# Patient Record
Sex: Male | Born: 1950 | Race: Black or African American | Hispanic: No | Marital: Married | Smoking: Never smoker
Health system: Southern US, Community
[De-identification: ages and names within clinical notes are randomized; demographics above are authoritative.]

## PROBLEM LIST (undated history)

## (undated) DIAGNOSIS — I10 Essential (primary) hypertension: Secondary | ICD-10-CM

## (undated) DIAGNOSIS — E7800 Pure hypercholesterolemia, unspecified: Secondary | ICD-10-CM

---

## 2016-11-26 LAB — GLUCOSE, POCT (MANUAL RESULT ENTRY): POC GLUCOSE: 110 mg/dL — AB (ref 70–99)

## 2020-04-21 ENCOUNTER — Other Ambulatory Visit: Payer: Self-pay

## 2020-04-21 ENCOUNTER — Emergency Department (HOSPITAL_BASED_OUTPATIENT_CLINIC_OR_DEPARTMENT_OTHER)
Admission: EM | Admit: 2020-04-21 | Discharge: 2020-04-21 | Disposition: A | Discharge status: Home / Self Care | Payer: Federal, State, Local not specified - PPO | Attending: Emergency Medicine | Admitting: Emergency Medicine

## 2020-04-21 ENCOUNTER — Encounter (HOSPITAL_BASED_OUTPATIENT_CLINIC_OR_DEPARTMENT_OTHER): Payer: Self-pay | Admitting: Emergency Medicine

## 2020-04-21 DIAGNOSIS — I159 Secondary hypertension, unspecified: Secondary | ICD-10-CM

## 2020-04-21 DIAGNOSIS — I10 Essential (primary) hypertension: Secondary | ICD-10-CM | POA: Diagnosis present

## 2020-04-21 DIAGNOSIS — Z79899 Other long term (current) drug therapy: Secondary | ICD-10-CM | POA: Diagnosis not present

## 2020-04-21 DIAGNOSIS — R42 Dizziness and giddiness: Secondary | ICD-10-CM

## 2020-04-21 HISTORY — DX: Essential (primary) hypertension: I10

## 2020-04-21 HISTORY — DX: Pure hypercholesterolemia, unspecified: E78.00

## 2020-04-21 NOTE — ED Triage Notes (Signed)
Pt arrives pov with concern for B/P after being sent by UC. Pt originally went to [facility] for covid test d/t sweating and dizziness today. Pt denies dizziness at this time.

## 2020-04-21 NOTE — ED Notes (Signed)
ED Provider at bedside. 

## 2020-04-21 NOTE — ED Provider Notes (Signed)
[facility] EMERGENCY DEPARTMENT Provider Note   CSN: [id number] Arrival date & time: 04/21/20  1307     History Chief Complaint  Patient presents with  . Hypertension    William Fox is a 69 y.o. male.  Patient sent here from urgent care for EKG.  Patient had an episode of dizziness today.  Blood pressure was elevated but states that he has not taken his blood pressure medications today.  Urgent care did not have the capability to get EKG.  He is asymptomatic.  No chest pain, no shortness of breath.  Patient was concerned for Covid symptoms and had a Covid test done at urgent care.  The history is provided by the patient.  Hypertension This is a chronic problem. The problem occurs daily. The problem has not changed since onset.Pertinent negatives include no chest pain, no abdominal pain, no headaches and no shortness of breath. Nothing aggravates the symptoms. Nothing relieves the symptoms. He has tried nothing for the symptoms. The treatment provided no relief.       Past Medical History:  Diagnosis Date  . Hypercholesteremia   . Hypertension     There are no problems to display for this patient.   History reviewed. No pertinent surgical history.     History reviewed. No pertinent family history.  Social History   Tobacco Use  . Smoking status: Never Smoker  Substance Use Topics  . Alcohol use: Yes    Comment: social  . Drug use: Never    Home Medications Prior to Admission medications   Medication Sig Start Date End Date Taking? Authorizing Provider  benazepril (LOTENSIN) 40 MG tablet Take 1 tablet by mouth daily. 01/16/20  Yes [provider]  labetalol (NORMODYNE) 100 MG tablet Take 1 tablet by mouth 2 (two) times daily. 12/14/19  Yes [provider]  rosuvastatin (CRESTOR) 10 MG tablet TAKE 1 TABLET BY MOUTH EVERY DAY AT NIGHT 02/27/20  Yes [provider]    Allergies    Patient has no allergy information on  record.  Review of Systems   Review of Systems  Constitutional: Negative for chills and fever.  HENT: Negative for ear pain and sore throat.   Eyes: Negative for pain and visual disturbance.  Respiratory: Negative for cough and shortness of breath.   Cardiovascular: Negative for chest pain and palpitations.  Gastrointestinal: Negative for abdominal pain and vomiting.  Genitourinary: Negative for dysuria and hematuria.  Musculoskeletal: Negative for arthralgias and back pain.  Skin: Negative for color change and rash.  Neurological: Positive for dizziness. Negative for seizures, syncope and headaches.  All other systems reviewed and are negative.   Physical Exam Updated Vital Signs BP (!) 186/104   Pulse 64   Temp 98.6 F (37 C)   Resp 18   Ht 5\' 9"  (1.753 m)   Wt 88.5 kg   SpO2 99%   BMI 28.80 kg/m   Physical Exam Vitals and nursing note reviewed.  Constitutional:      Appearance: He is well-developed.  HENT:     Head: Normocephalic and atraumatic.     Nose: Nose normal.     Mouth/Throat:     Mouth: Mucous membranes are moist.  Eyes:     Extraocular Movements: Extraocular movements intact.     Conjunctiva/sclera: Conjunctivae normal.     Pupils: Pupils are equal, round, and reactive to light.  Cardiovascular:     Rate and Rhythm: Normal rate and regular rhythm.  Pulses: Normal pulses.     Heart sounds: No murmur heard.   Pulmonary:     Effort: Pulmonary effort is normal. No respiratory distress.     Breath sounds: Normal breath sounds.  Abdominal:     Palpations: Abdomen is soft.     Tenderness: There is no abdominal tenderness.  Musculoskeletal:     Cervical back: Neck supple.  Skin:    General: Skin is warm and dry.  Neurological:     General: No focal deficit present.     Mental Status: He is alert and oriented to person, place, and time.     Cranial Nerves: No cranial nerve deficit.     Sensory: No sensory deficit.     Motor: No weakness.      Coordination: Coordination normal.     ED Results / Procedures / Treatments   Labs (all labs ordered are listed, but only abnormal results are displayed) Labs Reviewed - No data to display  EKG None  Radiology No results found.  Procedures Procedures (including critical care time)  Medications Ordered in ED Medications - No data to display  ED Course  I have reviewed the triage vital signs and the nursing notes.  Pertinent labs & imaging results that were available during my care of the patient were reviewed by me and considered in my medical decision making (see chart for details).    MDM Rules/Calculators/A&P                          William Fox is a 69 year old male with history of high blood pressure presents the ED with hypertension.  Mildly elevated blood pressure but otherwise normal vitals.  Normal neurological exam.  Sent by urgent care for EKG after episode of dizziness this morning.  Asymptomatic now.  Neurologically intact.  Blood pressure is high but he has not taken his chronic blood pressure medications today.  EKG shows sinus rhythm with PVCs.  States he has a history of the same.  No heart block.  Overall patient given reassurance.  Recommend follow-up with primary care doctor and discharged from ED in good condition.  This chart was dictated using voice recognition software.  Despite best efforts to proofread,  errors can occur which can change the documentation meaning.   Final Clinical Impression(s) / ED Diagnoses Final diagnoses:  Secondary hypertension  Dizziness    Rx / DC Orders ED Discharge Orders    None       Virgina Norfolk, DO 04/21/20 1351

## 2020-04-24 ENCOUNTER — Other Ambulatory Visit: Payer: Self-pay

## 2020-04-24 ENCOUNTER — Encounter (HOSPITAL_BASED_OUTPATIENT_CLINIC_OR_DEPARTMENT_OTHER): Payer: Self-pay | Admitting: *Deleted

## 2020-04-24 ENCOUNTER — Emergency Department (HOSPITAL_BASED_OUTPATIENT_CLINIC_OR_DEPARTMENT_OTHER): Payer: Federal, State, Local not specified - PPO

## 2020-04-24 ENCOUNTER — Emergency Department (HOSPITAL_BASED_OUTPATIENT_CLINIC_OR_DEPARTMENT_OTHER)
Admission: EM | Admit: 2020-04-24 | Discharge: 2020-04-24 | Disposition: A | Discharge status: Home / Self Care | Payer: Federal, State, Local not specified - PPO | Attending: Emergency Medicine | Admitting: Emergency Medicine

## 2020-04-24 DIAGNOSIS — I10 Essential (primary) hypertension: Secondary | ICD-10-CM | POA: Diagnosis not present

## 2020-04-24 DIAGNOSIS — Z79899 Other long term (current) drug therapy: Secondary | ICD-10-CM | POA: Diagnosis not present

## 2020-04-24 DIAGNOSIS — I160 Hypertensive urgency: Secondary | ICD-10-CM

## 2020-04-24 DIAGNOSIS — R42 Dizziness and giddiness: Secondary | ICD-10-CM

## 2020-04-24 LAB — BASIC METABOLIC PANEL
Anion gap: 10 (ref 5–15)
BUN: 13 mg/dL (ref 8–23)
CO2: 26 mmol/L (ref 22–32)
Calcium: 9.3 mg/dL (ref 8.9–10.3)
Chloride: 100 mmol/L (ref 98–111)
Creatinine, Ser: 1.36 mg/dL — ABNORMAL HIGH (ref 0.61–1.24)
GFR, Estimated: 53 mL/min — ABNORMAL LOW (ref >60)
Glucose, Bld: 97 mg/dL (ref 70–99)
Potassium: 3.4 mmol/L — ABNORMAL LOW (ref 3.5–5.1)
Sodium: 136 mmol/L (ref 135–145)

## 2020-04-24 LAB — CBC
HCT: 46.8 % (ref 39.0–52.0)
Hemoglobin: 16.1 g/dL (ref 13.0–17.0)
MCH: 30.6 pg (ref 26.0–34.0)
MCHC: 34.4 g/dL (ref 30.0–36.0)
MCV: 88.8 fL (ref 80.0–100.0)
Platelets: 150 10*3/uL (ref 150–400)
RBC: 5.27 MIL/uL (ref 4.22–5.81)
RDW: 12.9 % (ref 11.5–15.5)
WBC: 10.6 10*3/uL — ABNORMAL HIGH (ref 4.0–10.5)
nRBC: 0 % (ref 0.0–0.2)

## 2020-04-24 LAB — TROPONIN I (HIGH SENSITIVITY)
Troponin I (High Sensitivity): 11 ng/L (ref <18)
Troponin I (High Sensitivity): 14 ng/L (ref <18)

## 2020-04-24 LAB — CBG MONITORING, ED: Glucose-Capillary: 103 mg/dL — ABNORMAL HIGH (ref 70–99)

## 2020-04-24 MED ORDER — HYDRALAZINE HCL 20 MG/ML IJ SOLN
20.0000 mg | Freq: Once | INTRAMUSCULAR | Status: AC
  Administered 2020-04-24: 20 mg via INTRAVENOUS
  Filled 2020-04-24: qty 1

## 2020-04-24 MED ORDER — NITROGLYCERIN 2 % TD OINT: 1.0000 [in_us] | TOPICAL_OINTMENT | Freq: Four times a day (QID) | TRANSDERMAL | Status: DC

## 2020-04-24 MED ORDER — LABETALOL HCL 5 MG/ML IV SOLN
20.0000 mg | Freq: Once | INTRAVENOUS | Status: AC
  Administered 2020-04-24: 20 mg via INTRAVENOUS
  Filled 2020-04-24: qty 4

## 2020-04-24 MED ORDER — LABETALOL HCL 100 MG PO TABS
100.0000 mg | ORAL_TABLET | Freq: Once | ORAL | Status: AC
  Administered 2020-04-24: 100 mg via ORAL
  Filled 2020-04-24: qty 1

## 2020-04-24 MED ORDER — LABETALOL HCL 100 MG PO TABS: 200.0000 mg | ORAL_TABLET | Freq: Two times a day (BID) | ORAL | 0 refills | Status: AC

## 2020-04-24 MED ORDER — HYDRALAZINE HCL 10 MG PO TABS: 10.0000 mg | ORAL_TABLET | Freq: Three times a day (TID) | ORAL | 0 refills | Status: AC | PRN

## 2020-04-24 NOTE — ED Provider Notes (Signed)
[facility] EMERGENCY DEPARTMENT Provider Note   CSN: [id number] Arrival date & time: 04/24/20  1850     History No chief complaint on file.   William Fox is a 69 y.o. male.  HPI Patient episode of lightheadedness and dizziness on Saturday.  He had gone to the urgent care due to concern for possible Covid symptoms.  Testing was done in urgent care but patient was dizzy and referred to the emergency department for further evaluation.  Patient reports that symptom he was concerned about was having sweats.  He has not had cough, shortness of breath or chest pain.  No headache, blurred vision or double vision.  Patient reports again today he started to feel lightheaded and kind of shaky.  Reportedly he was repeating himself but did not have any focal weakness numbness tingling or visual changes.  Patient denies he had a headache during the episode.  He reports again he felt sweaty.  No chest pain, shortness of breath or syncopal episode.  Patient has not been having lower extremity swelling or calf pain.  No personal history of DVT or PE.  He reports his sister did have a PE from which she died.  Patient reports he is compliant with the blood pressure medications.  He reports he is already taking the medications today.  He reports typically her blood pressures are controlled around 140s over 90s, but at the doctor's office is more typically 160s over 90s to 100.    Past Medical History:  Diagnosis Date   Hypercholesteremia    Hypertension     There are no problems to display for this patient.   History reviewed. No pertinent surgical history.     No family history on file.  Social History   Tobacco Use   Smoking status: Never Smoker   Smokeless tobacco: Never Used  Substance Use Topics   Alcohol use: Yes    Comment: social   Drug use: Never    Home Medications Prior to Admission medications   Medication Sig Start Date End Date Taking? Authorizing  Provider  benazepril (LOTENSIN) 40 MG tablet Take 1 tablet by mouth daily. 01/16/20  Yes [provider]  labetalol (NORMODYNE) 100 MG tablet Take 1 tablet by mouth 2 (two) times daily. 12/14/19  Yes [provider]  rosuvastatin (CRESTOR) 10 MG tablet TAKE 1 TABLET BY MOUTH EVERY DAY AT NIGHT 02/27/20  Yes [provider]  hydrALAZINE (APRESOLINE) 10 MG tablet Take 1 tablet (10 mg total) by mouth 3 (three) times daily as needed. You may take a tablet up to 3 times a day if your blood pressure remains greater than 160/90 after taking your full dose of labetalol. 04/24/20   Arby Barrette, MD  labetalol (NORMODYNE) 100 MG tablet Take 2 tablets (200 mg total) by mouth 2 (two) times daily. 04/24/20   Arby Barrette, MD    Allergies    Hydrochlorothiazide, Penicillins, Amlodipine, and Sulfamethoxazole  Review of Systems   Review of Systems 10 Systems reviewed and are negative for acute change except as noted in the HPI.  Physical Exam Updated Vital Signs BP (!) 165/107    Pulse 80    Temp 97.7 F (36.5 C) (Oral)    Resp 14    Ht 5\' 9"  (1.753 m)    Wt 88.5 kg    SpO2 100%    BMI 28.81 kg/m   Physical Exam Constitutional:      Appearance: Normal appearance. He is well-developed.  HENT:     Head: Normocephalic and atraumatic.     Mouth/Throat:     Pharynx: Oropharynx is clear.  Eyes:     Extraocular Movements: Extraocular movements intact.     Pupils: Pupils are equal, round, and reactive to light.  Cardiovascular:     Rate and Rhythm: Normal rate and regular rhythm.     Heart sounds: Normal heart sounds.  Pulmonary:     Effort: Pulmonary effort is normal.     Breath sounds: Normal breath sounds.  Abdominal:     General: Bowel sounds are normal. There is no distension.     Palpations: Abdomen is soft.     Tenderness: There is no abdominal tenderness.  Musculoskeletal:        General: No swelling or tenderness. Normal range of motion.     Cervical back:  Neck supple.     Right lower leg: No edema.     Left lower leg: No edema.  Skin:    General: Skin is warm and dry.  Neurological:     General: No focal deficit present.     Mental Status: He is alert and oriented to person, place, and time.     GCS: GCS eye subscore is 4. GCS verbal subscore is 5. GCS motor subscore is 6.     Cranial Nerves: No cranial nerve deficit.     Sensory: No sensory deficit.     Motor: No weakness.     Coordination: Coordination normal.  Psychiatric:        Mood and Affect: Mood normal.     ED Results / Procedures / Treatments   Labs (all labs ordered are listed, but only abnormal results are displayed) Labs Reviewed  BASIC METABOLIC PANEL - Abnormal; Notable for the following components:      Result Value   Potassium 3.4 (*)    Creatinine, Ser 1.36 (*)    GFR, Estimated 53 (*)    All other components within normal limits  CBC - Abnormal; Notable for the following components:   WBC 10.6 (*)    All other components within normal limits  CBG MONITORING, ED - Abnormal; Notable for the following components:   Glucose-Capillary 103 (*)    All other components within normal limits  TROPONIN I (HIGH SENSITIVITY)  TROPONIN I (HIGH SENSITIVITY)    EKG EKG Interpretation  Date/Time:  Tuesday April 24 2020 19:23:43 EDT Ventricular Rate:  74 PR Interval:    QRS Duration: 94 QT Interval:  398 QTC Calculation: 381 R Axis:   8 Text Interpretation: Sinus rhythm Multiform ventricular premature complexes Consider left ventricular hypertrophy Anterior Q waves, possibly due to LVH agree, no interval change Confirmed by Arby Barrette [phone]) on 04/24/2020 11:39:11 PM   Radiology DG Chest 2 View  Result Date: 04/24/2020 CLINICAL DATA:  Hypertension EXAM: CHEST - 2 VIEW COMPARISON:  None. FINDINGS: The heart size and mediastinal contours are within normal limits. Both lungs are clear. The visualized skeletal structures are unremarkable. IMPRESSION: No  active cardiopulmonary disease. Electronically Signed   By: Alcide Clever M.D.   On: 04/24/2020 19:22   CT Head Wo Contrast  Result Date: 04/24/2020 CLINICAL DATA:  Nonspecific dizziness. Lightheadedness. Feels fine now. EXAM: CT HEAD WITHOUT CONTRAST TECHNIQUE: Contiguous axial images were obtained from the base of the skull through the vertex without intravenous contrast. COMPARISON:  None. FINDINGS: Brain: No evidence of acute infarction, hemorrhage, hydrocephalus, extra-axial collection or mass lesion/mass effect. Vascular: No hyperdense vessel  or unexpected calcification. Skull: Calvarium appears intact. Sinuses/Orbits: Paranasal sinuses and mastoid air cells are clear. Other: None. IMPRESSION: No acute intracranial abnormalities. Electronically Signed   By: Burman Nieves M.D.   On: 04/24/2020 23:02    Procedures Procedures (including critical care time) CRITICAL CARE Performed by: Arby Barrette   Total critical care time: 30 minutes  Critical care time was exclusive of separately billable procedures and treating other patients.  Critical care was necessary to treat or prevent imminent or life-threatening deterioration.  Critical care was time spent personally by me on the following activities: development of treatment plan with patient and/or surrogate as well as nursing, discussions with consultants, evaluation of patient's response to treatment, examination of patient, obtaining history from patient or surrogate, ordering and performing treatments and interventions, ordering and review of laboratory studies, ordering and review of radiographic studies, pulse oximetry and re-evaluation of patient's condition.  Medications Ordered in ED Medications  labetalol (NORMODYNE) tablet 100 mg (100 mg Oral Given by Other 04/24/20 2129)  labetalol (NORMODYNE) injection 20 mg (20 mg Intravenous Given 04/24/20 2012)  hydrALAZINE (APRESOLINE) injection 20 mg (20 mg Intravenous Given 04/24/20  2129)    ED Course  I have reviewed the triage vital signs and the nursing notes.  Pertinent labs & imaging results that were available during my care of the patient were reviewed by me and considered in my medical decision making (see chart for details).    MDM Rules/Calculators/A&P                         Patient presents with lightheadedness and hypertension.  Blood pressure significantly elevated on arrival with diastolic pressures up to 120s.  Patient was symptomatic with lightheadedness.  He did not have blurred vision or headache.  No chest pain.  Patient felt lightheaded and sweaty.  No focal motor deficit is present.  Treatment initiated with labetalol.  Patient does at baseline take oral labetalol 100 mg twice daily.  I did add a 20 mg IV dose and have the patient take an additional 100 mg evening oral dose.  As this did not result in significant change, hydralazine 20 mg added.  With addition of hydralazine blood pressures trended down to 160s over 100.  He did not develop any signs of endorgan damage.  CT head does not show any acute findings.  At this time, plan will be to increase labetalol dose to 200 mg twice daily with an as needed 100 mg dose in the  Afternoon.  A prescription of hydralazine was given to use as needed.  Blood pressures unresponsive to labetalol.  Final Clinical Impression(s) / ED Diagnoses Final diagnoses:  Hypertensive urgency  Lightheaded    Rx / DC Orders ED Discharge Orders         Ordered    labetalol (NORMODYNE) 100 MG tablet  2 times daily        04/24/20 2333    hydrALAZINE (APRESOLINE) 10 MG tablet  3 times daily PRN        04/24/20 2333           Arby Barrette, MD 04/24/20 2343

## 2020-04-24 NOTE — ED Triage Notes (Signed)
Lightheaded an hour ago. Family states he was shaking and repeating himself. He feels fine now. He was here Saturday for the same.

## 2020-04-24 NOTE — Discharge Instructions (Addendum)
1.  Increase your labetalol dose to 200 mg twice a day.  You may also take a 100 mg dose at lunchtime if your blood pressure remaining greater than 160s over 90s. 2.  If, after taking your full hydralazine dose, blood pressure still remain greater than 160s over 90s, take a hydralazine tablet as well.  Keep a log of your blood pressures up to 3 times a day. 3.  See your doctor tomorrow as scheduled. 4.  Return to the emergency department immediately if you develop a bad headache, blurred or double vision, focal weakness numbness or tingling, confusion, slurred speech or other concerning symptoms.

## 2021-02-07 IMAGING — DX DG CHEST 2V
2 series · 2 of 2 positions shown · non-contrast
Comparison: None.

CLINICAL DATA: Hypertension

EXAM:
CHEST - 2 VIEW

[chest lat]
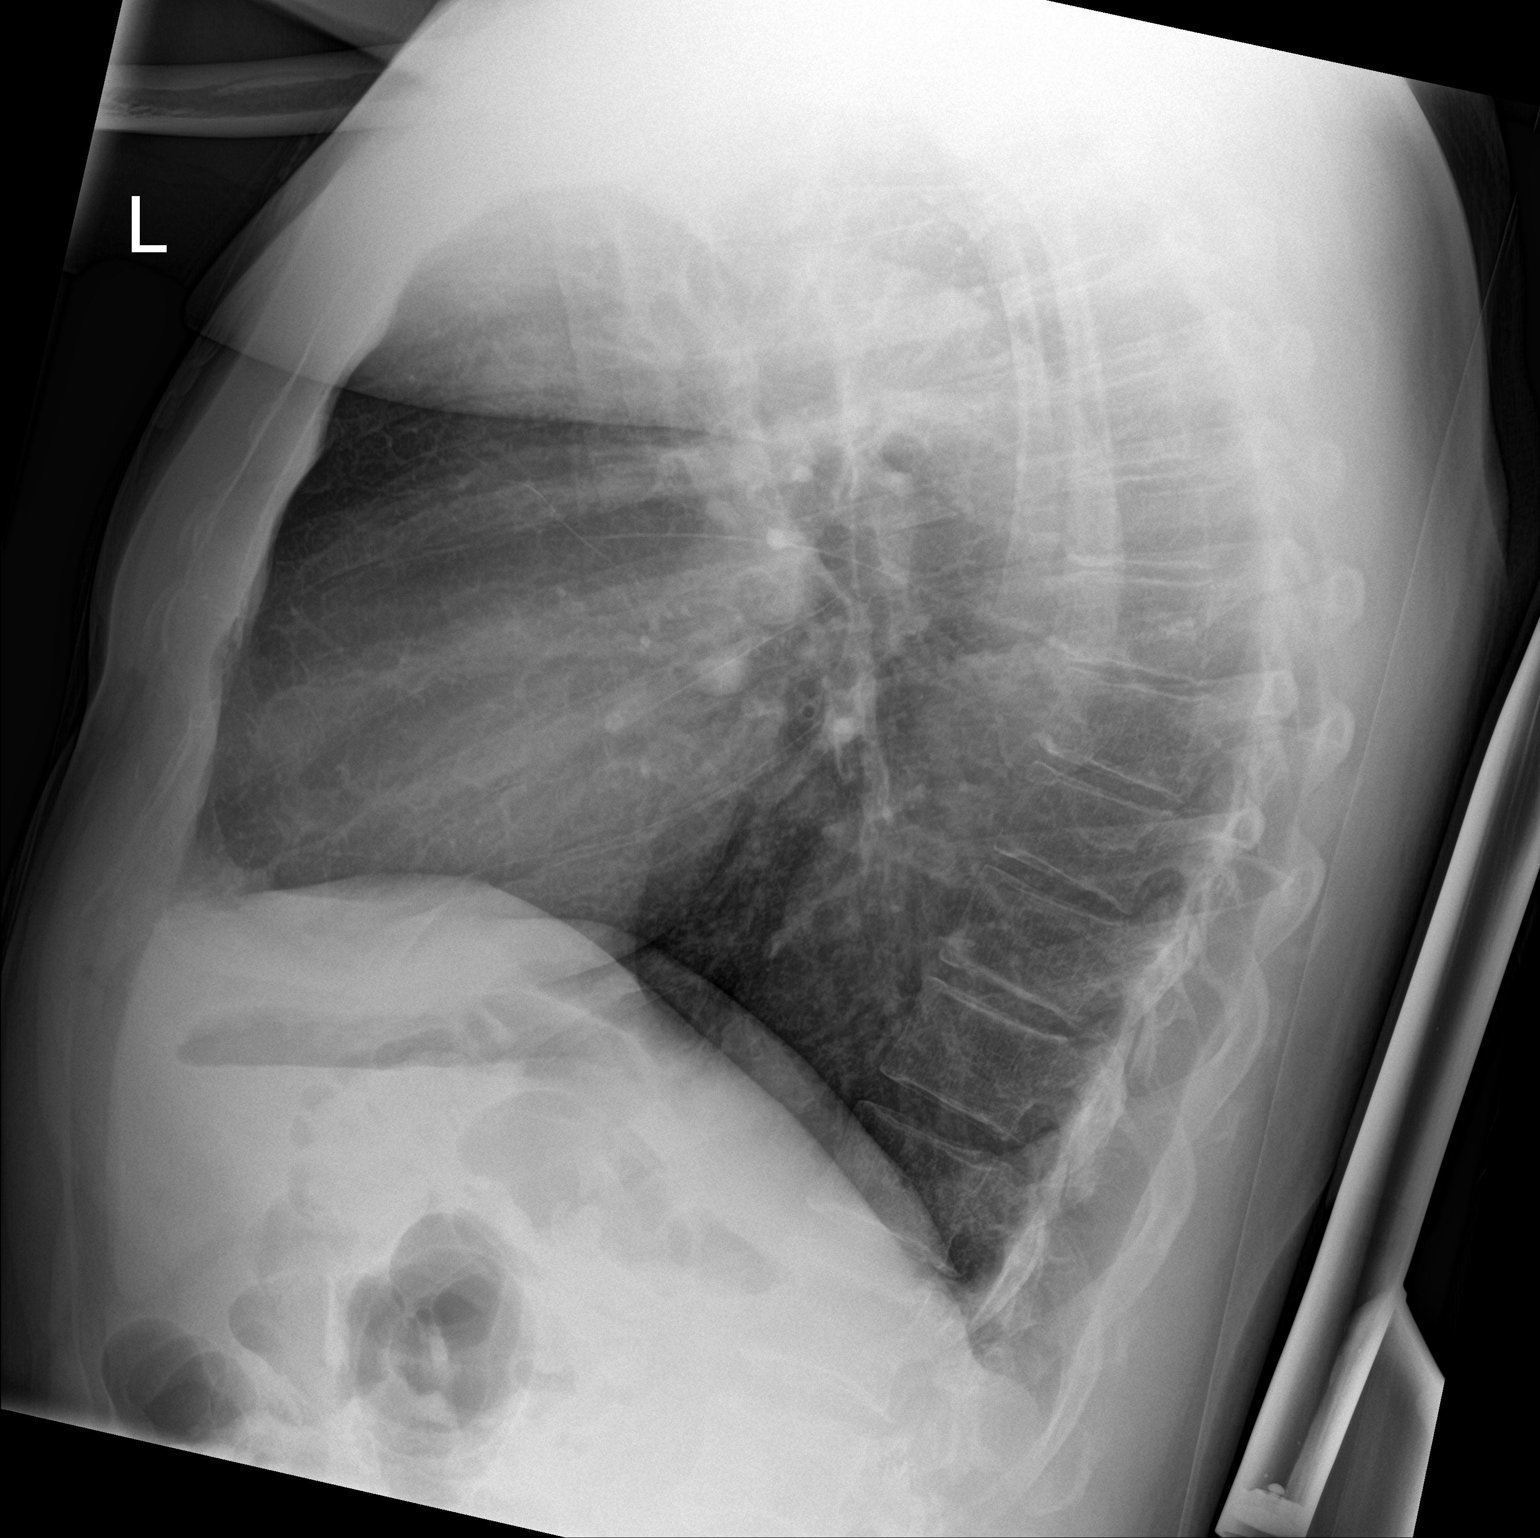

[chest ap]
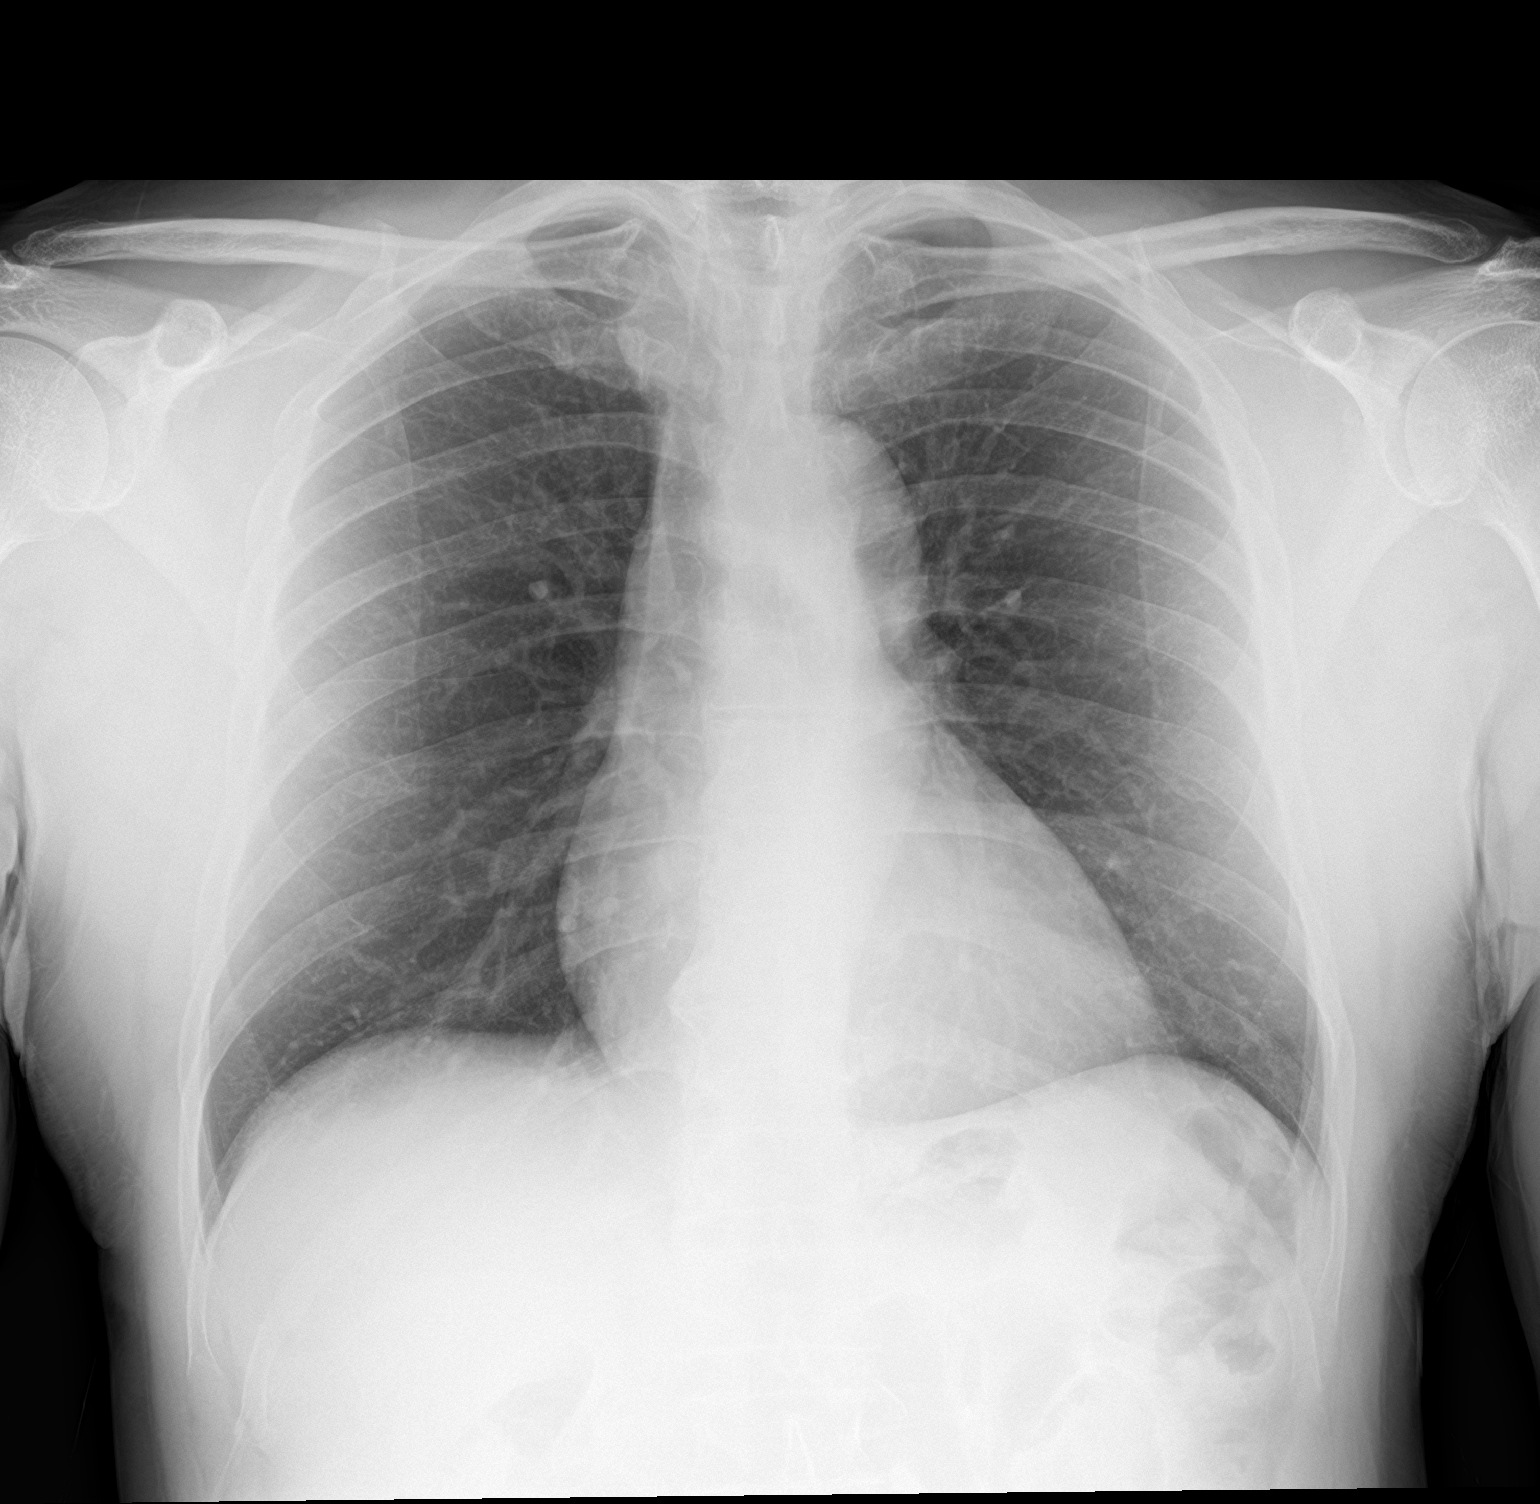

[2 of 2 positions shown; findings below may reference images not displayed]

FINDINGS: The heart size and mediastinal contours are within normal limits.
Both lungs are clear. The visualized skeletal structures are
unremarkable.
IMPRESSION: No active cardiopulmonary disease.

## 2021-02-07 IMAGING — CT CT HEAD W/O CM
3 series · 16 of 47 positions shown, 19 images · non-contrast
Comparison: None.

CLINICAL DATA: Nonspecific dizziness. Lightheadedness. Feels fine
now.

EXAM:
CT HEAD WITHOUT CONTRAST
TECHNIQUE: Contiguous axial images were obtained from the base of the skull
through the vertex without intravenous contrast.

[Series 2: head wo · axial · 0.43mm/px · z∈[+1190,+1316]mm · 10 of 31 slices shown, 13 images]
[im 3/31  brain]
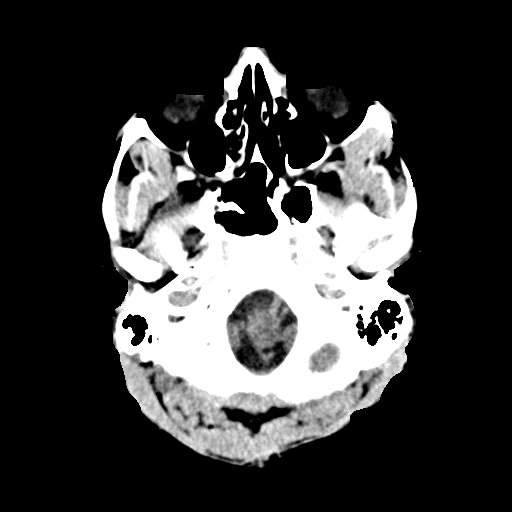
[im 3/31  bone]
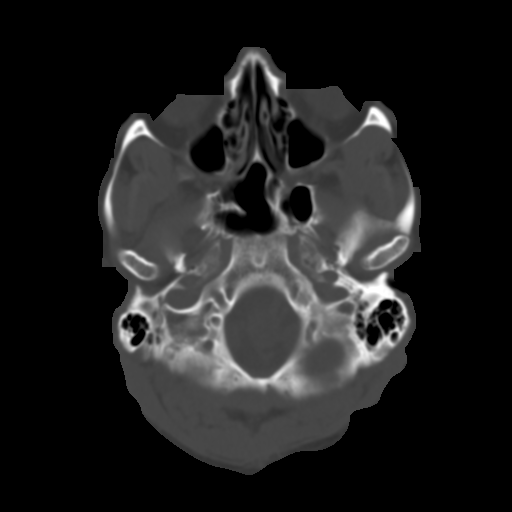
[im 6/31  brain]
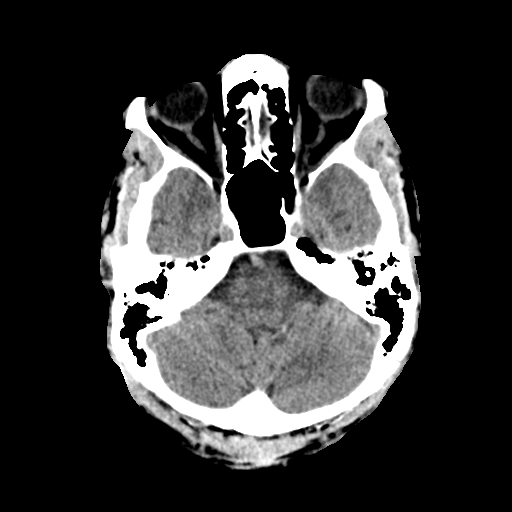
[im 9/31  brain]
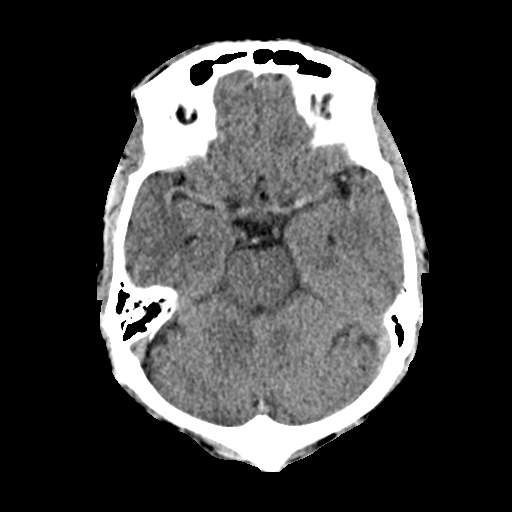
[im 11/31  brain]
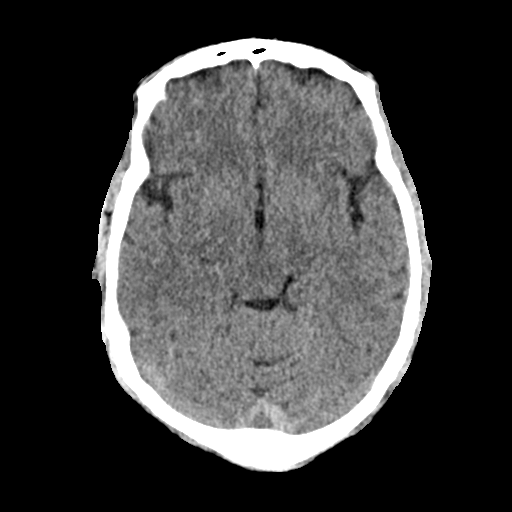
[im 14/31  brain]
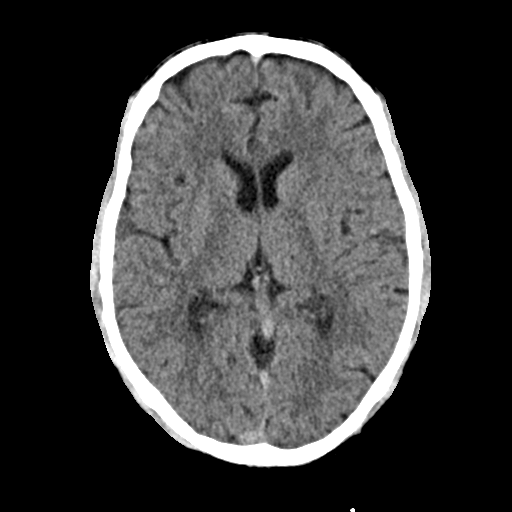
[im 14/31  bone]
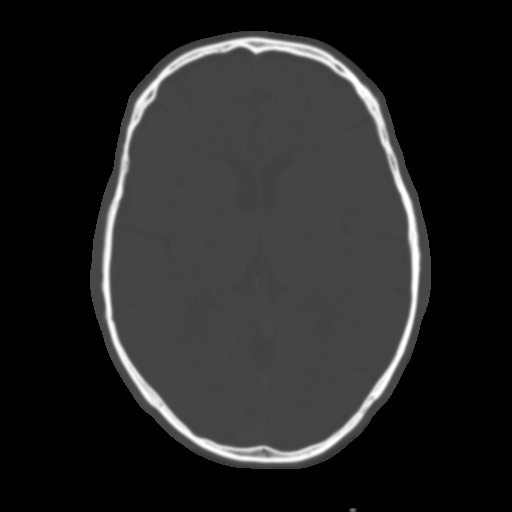
[im 17/31  brain]
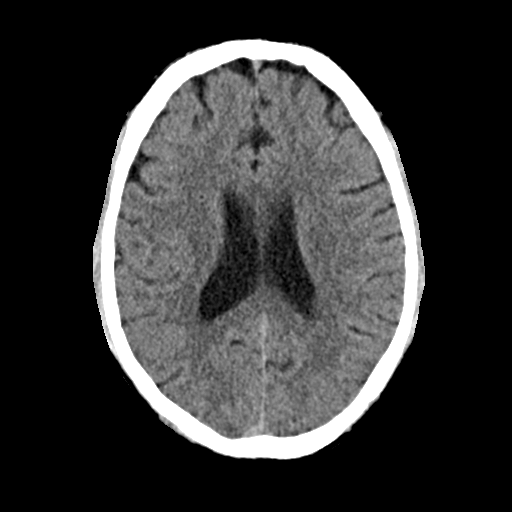
[im 20/31  brain]
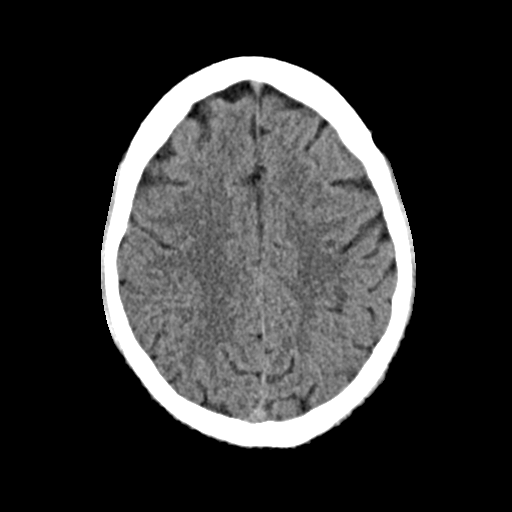
[im 23/31  brain]
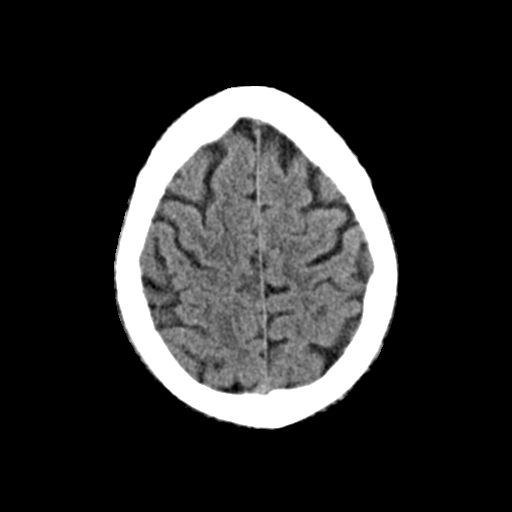
[im 25/31  brain]
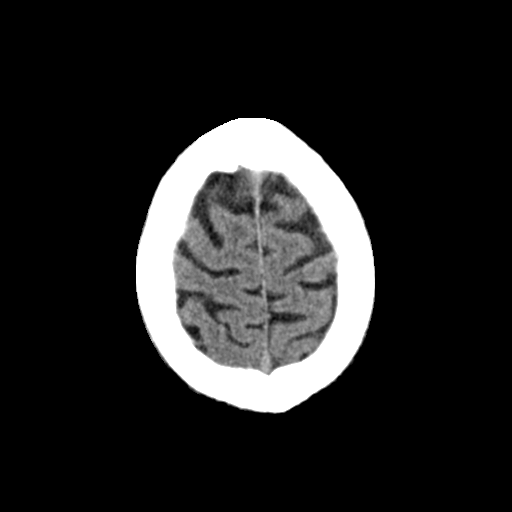
[im 25/31  bone]
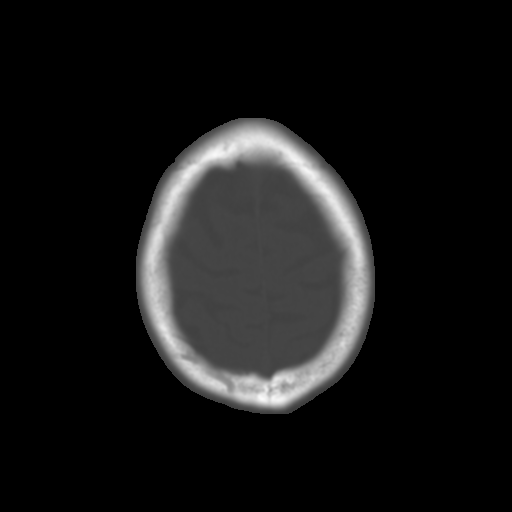
[im 28/31  brain]
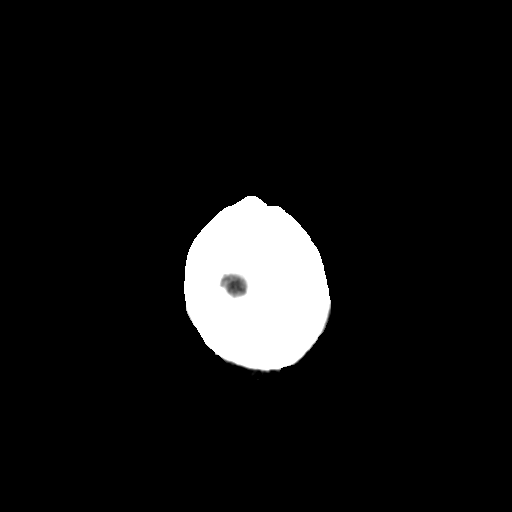

[Series 4: coronal soft · coronal · 0.32mm/px · 3 of 66 slices shown]
[im 22/66  brain]
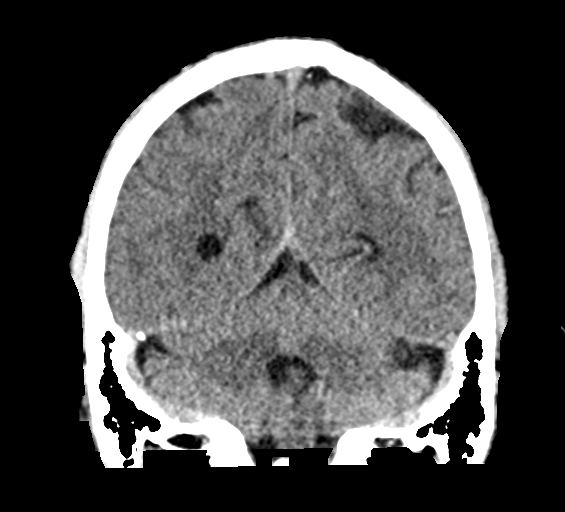
[im 29/66  brain]
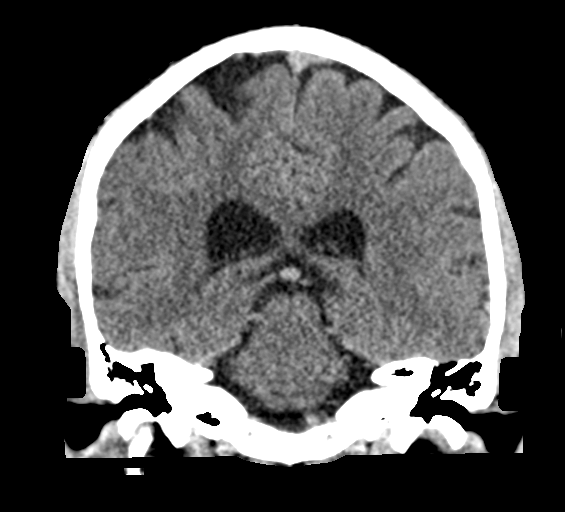
[im 37/66  brain]
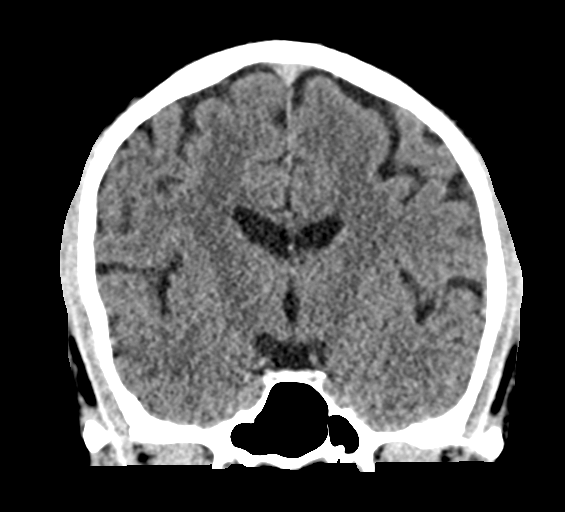

[Series 5: sag soft · sagittal · 0.32mm/px · 3 of 50 slices shown]
[im 17/50  brain]
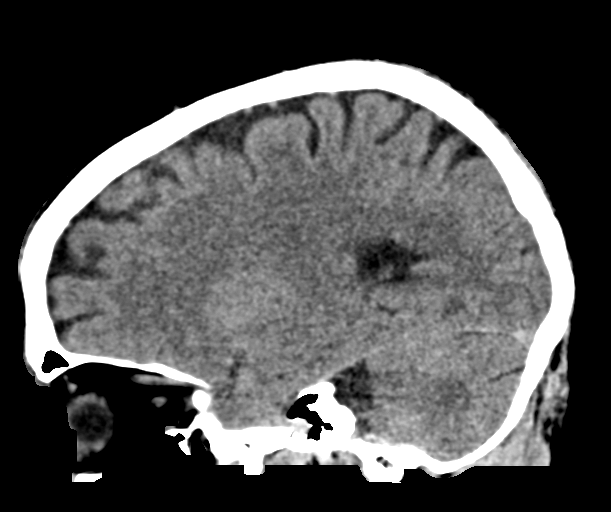
[im 25/50  brain]
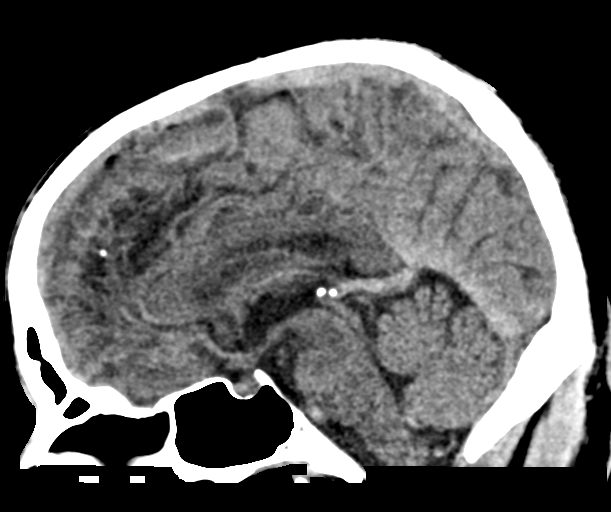
[im 33/50  brain]
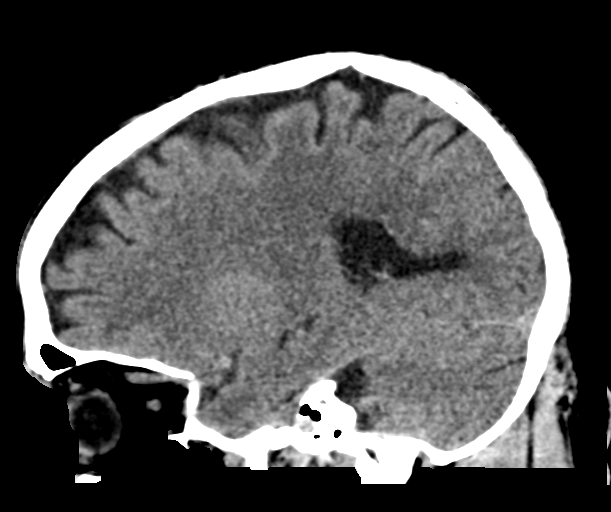

[16 of 47 positions shown; findings below may reference images not displayed]

FINDINGS: Brain: No evidence of acute infarction, hemorrhage, hydrocephalus,
extra-axial collection or mass lesion/mass effect.

Vascular: No hyperdense vessel or unexpected calcification.

Skull: Calvarium appears intact.

Sinuses/Orbits: Paranasal sinuses and mastoid air cells are clear.

Other: None.
IMPRESSION: No acute intracranial abnormalities.

## 2022-01-27 ENCOUNTER — Other Ambulatory Visit (HOSPITAL_COMMUNITY): Payer: Self-pay | Admitting: Orthopedic Surgery

## 2022-01-27 ENCOUNTER — Ambulatory Visit (HOSPITAL_COMMUNITY)
Admission: RE | Admit: 2022-01-27 | Discharge: 2022-01-27 | Disposition: A | Discharge status: Home / Self Care | Payer: Federal, State, Local not specified - PPO | Source: Ambulatory Visit | Attending: Orthopedic Surgery | Admitting: Orthopedic Surgery

## 2022-01-27 DIAGNOSIS — M79604 Pain in right leg: Secondary | ICD-10-CM | POA: Diagnosis present

## 2022-01-27 DIAGNOSIS — M7989 Other specified soft tissue disorders: Secondary | ICD-10-CM | POA: Diagnosis not present
